# Patient Record
Sex: Male | Born: 1985 | Hispanic: No | Marital: Married | Smoking: Former smoker
Health system: Southern US, Community
[De-identification: ages and names within clinical notes are randomized; demographics above are authoritative.]

## PROBLEM LIST (undated history)

## (undated) DIAGNOSIS — E119 Type 2 diabetes mellitus without complications: Secondary | ICD-10-CM

---
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
## 2005-09-14 ENCOUNTER — Emergency Department (HOSPITAL_COMMUNITY): Admission: EM | Admit: 2005-09-14 | Discharge: 2005-09-14 | Payer: Self-pay | Admitting: Emergency Medicine

## 2005-10-10 ENCOUNTER — Ambulatory Visit (HOSPITAL_COMMUNITY): Admission: RE | Admit: 2005-10-10 | Discharge: 2005-10-10 | Payer: Self-pay | Admitting: *Deleted

## 2006-02-08 ENCOUNTER — Encounter: Admission: RE | Admit: 2006-02-08 | Discharge: 2006-02-08 | Payer: Self-pay | Admitting: Orthopedic Surgery
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
## 2009-04-03 ENCOUNTER — Emergency Department (HOSPITAL_COMMUNITY): Admission: EM | Admit: 2009-04-03 | Discharge: 2009-04-03 | Payer: Self-pay | Admitting: Family Medicine

## 2009-05-17 ENCOUNTER — Ambulatory Visit: Payer: Self-pay | Admitting: Family Medicine

## 2009-05-17 DIAGNOSIS — E1165 Type 2 diabetes mellitus with hyperglycemia: Secondary | ICD-10-CM

## 2009-05-17 LAB — CONVERTED CEMR LAB: Hgb A1c MFr Bld: 8.6 %

## 2009-10-07 ENCOUNTER — Encounter (INDEPENDENT_AMBULATORY_CARE_PROVIDER_SITE_OTHER): Payer: Self-pay

## 2011-02-17 ENCOUNTER — Encounter: Payer: Self-pay | Admitting: *Deleted

## 2011-04-04 LAB — GLUCOSE, CAPILLARY: Glucose-Capillary: 207 mg/dL — ABNORMAL HIGH (ref 70–99)

## 2011-04-05 LAB — POCT I-STAT, CHEM 8
BUN: 12 mg/dL (ref 6–23)
Calcium, Ion: 1.11 mmol/L — ABNORMAL LOW (ref 1.12–1.32)
Chloride: 97 meq/L (ref 96–112)
Creatinine, Ser: 0.7 mg/dL (ref 0.4–1.5)
Glucose, Bld: 472 mg/dL — ABNORMAL HIGH (ref 70–99)
HCT: 50 % (ref 39.0–52.0)
Hemoglobin: 17 g/dL (ref 13.0–17.0)
Potassium: 4.1 meq/L (ref 3.5–5.1)
Sodium: 135 meq/L (ref 135–145)
TCO2: 29 mmol/L (ref 0–100)

## 2011-04-05 LAB — GLUCOSE, CAPILLARY
Glucose-Capillary: 192 mg/dL — ABNORMAL HIGH (ref 70–99)
Glucose-Capillary: 508 mg/dL (ref 70–99)

## 2012-08-20 ENCOUNTER — Encounter: Payer: Self-pay | Admitting: Family Medicine

## 2012-08-20 ENCOUNTER — Ambulatory Visit (INDEPENDENT_AMBULATORY_CARE_PROVIDER_SITE_OTHER): Payer: Self-pay | Admitting: Family Medicine

## 2012-08-20 VITALS — BP 117/80 | HR 58 | Ht 67.0 in | Wt 188.0 lb

## 2012-08-20 DIAGNOSIS — L259 Unspecified contact dermatitis, unspecified cause: Secondary | ICD-10-CM

## 2012-08-20 DIAGNOSIS — E119 Type 2 diabetes mellitus without complications: Secondary | ICD-10-CM

## 2012-08-20 DIAGNOSIS — L309 Dermatitis, unspecified: Secondary | ICD-10-CM

## 2012-08-20 LAB — CBC WITH DIFFERENTIAL/PLATELET
Eosinophils Absolute: 0.1 10*3/uL (ref 0.0–0.7)
Hemoglobin: 15.4 g/dL (ref 13.0–17.0)
Lymphocytes Relative: 38 % (ref 12–46)
MCH: 31.4 pg (ref 26.0–34.0)
MCHC: 34.2 g/dL (ref 30.0–36.0)
MCV: 91.8 fL (ref 78.0–100.0)
Monocytes Absolute: 0.5 10*3/uL (ref 0.1–1.0)
Monocytes Relative: 7 % (ref 3–12)
Neutrophils Relative %: 53 % (ref 43–77)
Platelets: 258 10*3/uL (ref 150–400)
RDW: 13.5 % (ref 11.5–15.5)
WBC: 7.3 10*3/uL (ref 4.0–10.5)

## 2012-08-20 LAB — COMPREHENSIVE METABOLIC PANEL
ALT: 105 U/L — ABNORMAL HIGH (ref 0–53)
AST: 45 U/L — ABNORMAL HIGH (ref 0–37)
Albumin: 4.7 g/dL (ref 3.5–5.2)
CO2: 28 mEq/L (ref 19–32)
Calcium: 9.7 mg/dL (ref 8.4–10.5)

## 2012-08-20 LAB — LDL CHOLESTEROL, DIRECT: Direct LDL: 87 mg/dL

## 2012-08-20 MED ORDER — PREDNISONE 20 MG PO TABS: 20.0000 mg | ORAL_TABLET | Freq: Every day | ORAL | Status: AC

## 2012-08-20 MED ORDER — METFORMIN HCL 500 MG PO TABS: 500.0000 mg | ORAL_TABLET | Freq: Two times a day (BID) | ORAL | Status: DC

## 2012-08-20 NOTE — Patient Instructions (Addendum)
Fue un placer verle de nuevo hoy.   Para los granos en la piel, pienso que puede ser relacionado con alguna reaccion a un producto de higiene (detergente en el trabajo, etc).   Le estoy recetando PREDNISONA 20mg  UNA TABLETA DIARIO POR 5 DIAS.   Despues de los cinco dias de prednisona, quiero que tome LORATADINE 10mg  UNA TABLETA POR DIA.   PARA la diabetes, quiero que vuelva a tomar la METFORMIN 500mg  UNA TABLETA 2 VECES POR DIA.    Quiero que venga de nuevo para chequearle los laboratorios, estoy dejando ordenes en la computadora.   Quiero volver a verle en 1 A 2 MESES.  FOLLOW UP WITH DR Mauricio Po IN 1 TO 2 MONTHS FOR FOLLOW UP.

## 2012-08-21 NOTE — Assessment & Plan Note (Signed)
Not on meds for DM; discussed need for dilated eye exam annually.  To check labs including LDL cholesterol (nonfasting). Will need flu shot in Fall.

## 2012-08-21 NOTE — Assessment & Plan Note (Signed)
Diffuse nonpruritic skinlesions over entire body; sparing head, soles and palms. 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 Discussed option of a brief trial of low-dose systemic steroids for 5 days, recognizing this will likely increase blood glucose temporarily. To ask about detergents at work as well.  Will send some labs today; to include RPR despite low likelihood.  For follow up in the coming 1-2 months.

## 2012-08-21 NOTE — Progress Notes (Signed)
  Subjective:    Patient ID: Vincent Cochran, male    DOB: 08-Jan-1986, 26 y.o.   MRN: [id number]  HPI Visit conducted in Spanish. Patient here accompanied by his wife.   History of DM diagnosed 2 years ago; he has been out of his metformin for about 7 months, does not describe symptoms of hyperglycemia (no polydipsia, no polyuria, no weight changes).  His A1c today is quite elevated at 11.4%.   Presents out of concern for generalized red raised macules that have erupted recently.  Are not pruritic.  Started around ankles, but he has them on entire legs, arms, abdomen, trunk at this time.  Spares the scalp, face, soles and palms. No one at home with similar lesions.  Lives with wife and two children (one less than 2 yrs, the other in grade school).  Smokes 2 cigarettes a week, usually when at work.  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  He wears a protective coverall, which is washed by the company off-site (never wears it home).  Unsure if changes in detergents; he has not had a change in hygiene products or home laundry detergents (wife confirms this).    Family Hx; Father with adult-onset DM.    Review of SystemsSee above     Objective:   Physical Exam Well appearing, no apparent distress HEENT Neck supple,. No cervical adenopathy. Moist mucus membranes. Clear conjunctivae.  COR Regular S1S2 PULM Clear bilat FEET: no edema. Sensation with monofilament decreased in forefoot bilaterally. No maceration of skin between toes. No open areas.  SKIN: Raised erythematous macules, not fluctuant, across ankles, lower legs; bilateral forearms; abdomen, trunk; back.  Not in "Christmas tree" distribution.  Not excoriated. Spares palms and soles, also spares face and scalp.        Assessment & Plan:

## 2012-08-22 ENCOUNTER — Telehealth: Payer: Self-pay | Admitting: Family Medicine

## 2012-08-22 ENCOUNTER — Encounter: Payer: Self-pay | Admitting: Family Medicine

## 2012-08-22 NOTE — Telephone Encounter (Signed)
Called patient's home number to relay lab results.  No answer or voice mail.  Results letter mailed explaining lab results, requesting recheck of transaminases at next visit.  Paula Compton, MD

## 2012-10-15 ENCOUNTER — Ambulatory Visit (INDEPENDENT_AMBULATORY_CARE_PROVIDER_SITE_OTHER): Payer: Self-pay | Admitting: Family Medicine

## 2012-10-15 ENCOUNTER — Encounter: Payer: Self-pay | Admitting: Family Medicine

## 2012-10-15 VITALS — BP 122/85 | HR 60 | Ht 66.0 in | Wt 190.0 lb

## 2012-10-15 DIAGNOSIS — B360 Pityriasis versicolor: Secondary | ICD-10-CM

## 2012-10-15 DIAGNOSIS — B356 Tinea cruris: Secondary | ICD-10-CM | POA: Insufficient documentation

## 2012-10-15 DIAGNOSIS — L52 Erythema nodosum: Secondary | ICD-10-CM

## 2012-10-15 MED ORDER — KETOCONAZOLE 2 % EX SHAM: MEDICATED_SHAMPOO | CUTANEOUS | Status: DC

## 2012-10-15 NOTE — Patient Instructions (Addendum)
Creo que la roncha se debe a una infeccion de hongo que se llama "tinea".   Mande' una receta para un champu que se llama Ketoconazole 2%; uselo en todo el cuerpo en la hora de banarse por 5 dias seguidos, entonces 2 dias de la semana despues.   Quiero verle en un mes si no esta' resuelto.

## 2012-10-16 DIAGNOSIS — L52 Erythema nodosum: Secondary | ICD-10-CM | POA: Insufficient documentation

## 2012-10-16 NOTE — Assessment & Plan Note (Signed)
Plain KOH microscopy performed by me with possible scant hyphae noted.  I suspect erythema nodosum, however to consider widely disseminated tinea infection as well.  To treat for presumed tinea and see back. If EN , then should resolve on its own.  For interval follow up to see change and consider biopsy if worsening.

## 2012-10-16 NOTE — Progress Notes (Signed)
  Subjective:    Patient ID: Vincent Cochran, male    DOB: 08-02-1986, 26 y.o.   MRN: [id number]  HPI Visit in Spanish.  Here with his wife.  The red skin lesions have not improved.  No changes in hygiene products or detergents at home or at work.  Mildly pruritic.  Only over the legs.  Topical steroids helped with the itch    Review of Systems     Objective:   Physical Exam Well appearing, no apparent distress SKIN: erythematous macules, some  flat, others raised and nodular, over legs.  Sparing soles.  None fluctuant. A few that appear to be in later stages that appear similar to ecchymoses.         Assessment & Plan:

## 2012-11-04 ENCOUNTER — Encounter: Payer: Self-pay | Admitting: Home Health Services

## 2012-11-06 ENCOUNTER — Encounter: Payer: Self-pay | Admitting: Home Health Services

## 2013-03-12 ENCOUNTER — Encounter: Payer: Self-pay | Admitting: *Deleted

## 2013-04-30 ENCOUNTER — Other Ambulatory Visit: Payer: Self-pay | Admitting: Family Medicine

## 2013-06-10 ENCOUNTER — Ambulatory Visit (INDEPENDENT_AMBULATORY_CARE_PROVIDER_SITE_OTHER): Payer: Self-pay | Admitting: Family Medicine

## 2013-06-10 ENCOUNTER — Encounter: Payer: Self-pay | Admitting: Family Medicine

## 2013-06-10 VITALS — BP 114/79 | HR 60 | Ht 66.0 in | Wt 182.5 lb

## 2013-06-10 DIAGNOSIS — L52 Erythema nodosum: Secondary | ICD-10-CM

## 2013-06-10 DIAGNOSIS — E1165 Type 2 diabetes mellitus with hyperglycemia: Secondary | ICD-10-CM

## 2013-06-10 DIAGNOSIS — R7401 Elevation of levels of liver transaminase levels: Secondary | ICD-10-CM | POA: Insufficient documentation

## 2013-06-10 LAB — COMPREHENSIVE METABOLIC PANEL
BUN: 17 mg/dL (ref 6–23)
CO2: 28 mEq/L (ref 19–32)
Calcium: 10.1 mg/dL (ref 8.4–10.5)
Creat: 0.74 mg/dL (ref 0.50–1.35)
Glucose, Bld: 211 mg/dL — ABNORMAL HIGH (ref 70–99)
Total Bilirubin: 0.8 mg/dL (ref 0.3–1.2)

## 2013-06-10 LAB — POCT GLYCOSYLATED HEMOGLOBIN (HGB A1C): Hemoglobin A1C: 8.3

## 2013-06-10 MED ORDER — METFORMIN HCL 500 MG PO TABS: 1000.0000 mg | ORAL_TABLET | Freq: Two times a day (BID) | ORAL | Status: DC

## 2013-06-10 MED ORDER — METFORMIN HCL 500 MG PO TABS: ORAL_TABLET | ORAL | Status: DC

## 2013-06-10 NOTE — Patient Instructions (Addendum)
Fue un Research officer, trade union.  La prueba para el Mining engineer' mejor que antes (8.3%), pero todavia no esta' al nivel que queremos (menos de 7%).  Por eso, quiero aumentarle la dosis de la metformin 500mg  a 2 tabletas (1000mg ) 2 veces por dia. Mande' la receta nueva a la farmacia de [place] en [employer].  Para los nodulos en la piel, creo que es una condicion que se llama de Erythema nodosum.  Me gustaria hacerle una placa del pecho y posiblemente hacer una biopsia (sacar Lauris Poag) de la piel para determinar la causa.  Por favor traigame la cajita de la medicina de [place] que le ayudo' con este problema para yo saber que cosa fue.   Quiero volver a verle en 3 meses.   FOLLOW UP IN 3 MONTHS WITH DR Mauricio Po, OR SOONER AS NEEDED. ORANGE CARD.  RETINAL CAMERA EYE EXAM.

## 2013-06-11 ENCOUNTER — Telehealth: Payer: Self-pay | Admitting: Family Medicine

## 2013-06-11 NOTE — Assessment & Plan Note (Signed)
Unclear etiology.  To recheck CMet today; if remains elevated, to plan for RUQ [place] evaluation, CBC, viral hep panel, HIV. Denies regular EtOH or supplement/otc med use. Also to consider fasting lipid panel.

## 2013-06-11 NOTE — Telephone Encounter (Signed)
Called patient's home, spoke with wife (he is not home).  Notified that metabolic panel is unremarkable except for slightly elevated transaminases, which are better than before.  To come in for non-fasting labs when he is able (will check synthetic function, viral hepatitis panel, HIV, bilirubin).  Lack of elevation of Alk Phos makes cholestasis less likely.  Paula Compton, MD

## 2013-06-11 NOTE — Progress Notes (Signed)
  Subjective:    Patient ID: Vincent Cochran, male    DOB: 1986-07-26, 27 y.o.   MRN: [id number]  HPI Here for follow up of DM and skin lesionso n legs.  Visit in Spanish.  Wife is present as well.   Errin notes that he has felt better since taking metformin 500mg  bid.  Ran out 3 days ago.  No more polyuria or generalized weakness.  Initial A1c at diagnosis was in the 11-range.   Has had elevated transaminases in an ALT:AST 2:1 ratio. Rare alcohol one time a month.  No known exposure to viral hepatitis in the past.  No other over the counter medications or supplements.    Regarding skin lesions, has red bumps on legs that had gotten better with use of ketoconazole shampoo as body wash.  Initial lesions suspicious for EN, and I think this is still the case. Are still pruritic. Used a topical cream from [place] (does not recall the name) and was helpfulwhile he used it.   Social Hx. Smokes 1-2 cigarettes a month.  Drinks [employer] energy drinks.  Rare alcohol one time a month.  No known exposure to viral hepatitis in the past.  No other over the counter medications or supplements.    Review of Systems No diarrhea, no nausea/vomiting, no chest pain, no dyspnea, no fevers or chills.      Objective:   Physical Exam Well appearing, no apparent distress HEENT Neck supple. No adenopathy (Cervical or supraclavicular). COR Regular S1S2, no extra sounds PULM Clear bilaterally, no rales or wheezes ABD Soft, nontender.  MSK Raised erythematous nodules measuring @1cm  diameter across anterior shins bilaterally; not excoriated.  Not fluctuant.         Assessment & Plan:

## 2013-06-11 NOTE — Assessment & Plan Note (Signed)
Lesions on legs appears like EN.  Given association of EN with conditions such as Sarcoidosis, will order CXR.  For follow up visit for skin biopsy of lesion for a more definitive diagnosis.

## 2013-06-11 NOTE — Assessment & Plan Note (Signed)
Clinical improvement with initiation of metformin.  Patient reports adherence to the medication. Discussed smoking, activity and lifestyle.  Plan for increase in metformin dosing to max dose 1g bid.  For retinal camera imaging for DM retinopathy screening.

## 2013-10-16 ENCOUNTER — Other Ambulatory Visit: Payer: Self-pay | Admitting: Family Medicine

## 2013-10-20 ENCOUNTER — Other Ambulatory Visit: Payer: Self-pay | Admitting: Family Medicine

## 2013-11-28 ENCOUNTER — Other Ambulatory Visit: Payer: Self-pay | Admitting: Family Medicine

## 2014-06-11 ENCOUNTER — Telehealth: Payer: Self-pay | Admitting: Family Medicine

## 2014-06-17 NOTE — Telephone Encounter (Signed)
Pt # was incorrect. Was unable to leae message to schedule for BP, A1C and LDL lab work as part of DM care.

## 2015-01-29 ENCOUNTER — Encounter: Payer: Self-pay | Admitting: Family Medicine

## 2015-01-29 ENCOUNTER — Ambulatory Visit (INDEPENDENT_AMBULATORY_CARE_PROVIDER_SITE_OTHER): Payer: Self-pay | Admitting: Family Medicine

## 2015-01-29 VITALS — BP 123/77 | HR 60 | Temp 98.2°F | Ht 66.0 in | Wt 185.1 lb

## 2015-01-29 DIAGNOSIS — R740 Nonspecific elevation of levels of transaminase and lactic acid dehydrogenase [LDH]: Secondary | ICD-10-CM

## 2015-01-29 DIAGNOSIS — E1165 Type 2 diabetes mellitus with hyperglycemia: Secondary | ICD-10-CM

## 2015-01-29 DIAGNOSIS — IMO0002 Reserved for concepts with insufficient information to code with codable children: Secondary | ICD-10-CM

## 2015-01-29 DIAGNOSIS — B356 Tinea cruris: Secondary | ICD-10-CM

## 2015-01-29 DIAGNOSIS — R7401 Elevation of levels of liver transaminase levels: Secondary | ICD-10-CM

## 2015-01-29 LAB — COMPREHENSIVE METABOLIC PANEL
ALT: 49 U/L (ref 0–53)
AST: 22 U/L (ref 0–37)
Albumin: 4.8 g/dL (ref 3.5–5.2)
Alkaline Phosphatase: 67 U/L (ref 39–117)
BUN: 11 mg/dL (ref 6–23)
CALCIUM: 9.6 mg/dL (ref 8.4–10.5)
CO2: 32 meq/L (ref 19–32)
CREATININE: 0.72 mg/dL (ref 0.50–1.35)
Chloride: 97 mEq/L (ref 96–112)
Glucose, Bld: 242 mg/dL — ABNORMAL HIGH (ref 70–99)
Potassium: 4.4 mEq/L (ref 3.5–5.3)
SODIUM: 137 meq/L (ref 135–145)
Total Bilirubin: 0.7 mg/dL (ref 0.2–1.2)
Total Protein: 7.3 g/dL (ref 6.0–8.3)

## 2015-01-29 LAB — LIPID PANEL
CHOL/HDL RATIO: 3.4 ratio
Cholesterol: 139 mg/dL (ref 0–200)
HDL: 41 mg/dL (ref >39)
LDL Cholesterol: 75 mg/dL (ref 0–99)
Triglycerides: 114 mg/dL (ref <150)
VLDL: 23 mg/dL (ref 0–40)

## 2015-01-29 LAB — POCT GLYCOSYLATED HEMOGLOBIN (HGB A1C): HEMOGLOBIN A1C: 11.3

## 2015-01-29 MED ORDER — CLOTRIMAZOLE 1 % EX CREA: 1.0000 "application " | TOPICAL_CREAM | Freq: Two times a day (BID) | CUTANEOUS | Status: DC

## 2015-01-29 MED ORDER — METFORMIN HCL 500 MG PO TABS: 500.0000 mg | ORAL_TABLET | Freq: Two times a day (BID) | ORAL | Status: DC

## 2015-01-29 NOTE — Assessment & Plan Note (Signed)
Patient now off metformin about 1 year; no follow up since last visit here. Plan to reassess A1C and fasting labs, restart metformin now and addition of ACEI and likely ASA and consider statin after fasting labs are done. Discussed diet and exercise, he eats a fair amount of fried foods and we discussed this. To titrate down his diet sodas as well with preference for water.  Follow up in the coming 1-2 months.

## 2015-01-29 NOTE — Patient Instructions (Signed)
Fue un Research officer, trade unionplacer verle hoy.  Es importante mantener mas seguimiento para controlarle la diabetes.   Dieta de vegetal, menos fritura, mas agua. Limitar sodas aun de dieta.  Para el hongo, clotrimazole crema, aplicar 2 veces por dia, por 2 semanas.   PAra la diabetes, METFORMINA 500mg  una tableta manana y tarde (2 veces por dia).  Laboratorios hoy, estas en ayunas.  Quiero volver a verle en 4 a seis semanas.   FOLLOW UP IN 4 TO 6 WEEKS WITH [facility] PATIENT'S NEW CELL PHONE IS [phone], PLEASE UPDATE DEMOGRAPHICS.

## 2015-01-29 NOTE — Assessment & Plan Note (Signed)
Foreskin and head of penis with what appears to be candidal infection. Likely secondary to elevated glucose levels. Discussed this with him.

## 2015-01-29 NOTE — Assessment & Plan Note (Signed)
Rechecking CMet today; prior elevated transaminases over 18 months ago.  Will consider more workup pending results of these labs.

## 2015-01-29 NOTE — Progress Notes (Signed)
   Subjective:    Patient ID: Vincent Cochran, male    DOB: 02/11/1986, 29 y.o.   MRN: [id number]  HPI Visit conducted in Spanish. Patient's wife is here with him for portions of history, absent for exam and some sensitive history.   Patient for follow up, reports he has been feeling well since his last visit for DM follow up in June 2014.  Denies polydipsia, polyuria, but does report some foreskin itch and rash that is very bothersome.  Denies penile discharge, denies any sexual encounter outside his marital relationship.  Denies dysuria. No prior STI in the past.   Regarding DM, he had been taking metformin 1000mg  twice daily for a few months after his last appointment, but stopped when ran out.  Has felt well so didn't feel need to follow up.  Has used a shake of cilantro, nopal fruit (prickly pear cactus), celery which he takes about once a week. In reviewing his diet, he eats a fair amount of fried foods prepared at home.  Also drinks diet Coke a lot when thirsty.   Denies smoking since quitting about 2-1/2 years ago.  Drinks seldom, and in social situations per his report. Married, here with his wife today.     Review of Systems Denies fevers/chills, no cough or shortness of breath, no chest pain, no abdominal pain, no diarrhea or constipation or blood in stool. See above for remaining ROS.      Objective:   Physical Exam Well appearing, no apparent distress HEENT Neck supple, no cervical adenopathy, normal mucus membranes.  COR regular S1S2, no extra sounds PULM Clear bilaterally.  ABD Soft, nontender, nondistended.  GU: Uncircumcised; retraction of foreskin reveals erythematous head of penis with linear cracks in foreskin. Beefy red lesions, superficial. No urethral meatus discharge or erythema. FEET: inspection without tinea pedis between toes. Sensation in all areas intact with testing. No edema; palpable dp pulses bilaterally.        Assessment & Plan:

## 2015-02-01 ENCOUNTER — Encounter: Payer: Self-pay | Admitting: Family Medicine

## 2015-03-05 ENCOUNTER — Ambulatory Visit: Payer: Self-pay | Admitting: Family Medicine

## 2016-04-03 ENCOUNTER — Encounter: Payer: Self-pay | Admitting: Family Medicine

## 2018-06-28 ENCOUNTER — Emergency Department (HOSPITAL_COMMUNITY)
Admission: EM | Admit: 2018-06-28 | Discharge: 2018-06-28 | Disposition: A | Discharge status: Home / Self Care | Payer: Self-pay | Attending: Emergency Medicine | Admitting: Emergency Medicine

## 2018-06-28 ENCOUNTER — Encounter (HOSPITAL_COMMUNITY): Payer: Self-pay

## 2018-06-28 ENCOUNTER — Emergency Department (HOSPITAL_COMMUNITY): Payer: Self-pay

## 2018-06-28 DIAGNOSIS — E119 Type 2 diabetes mellitus without complications: Secondary | ICD-10-CM | POA: Insufficient documentation

## 2018-06-28 DIAGNOSIS — X58XXXA Exposure to other specified factors, initial encounter: Secondary | ICD-10-CM | POA: Insufficient documentation

## 2018-06-28 DIAGNOSIS — S20211A Contusion of right front wall of thorax, initial encounter: Secondary | ICD-10-CM | POA: Insufficient documentation

## 2018-06-28 DIAGNOSIS — Y929 Unspecified place or not applicable: Secondary | ICD-10-CM | POA: Insufficient documentation

## 2018-06-28 DIAGNOSIS — S20219A Contusion of unspecified front wall of thorax, initial encounter: Secondary | ICD-10-CM

## 2018-06-28 DIAGNOSIS — Y9389 Activity, other specified: Secondary | ICD-10-CM | POA: Insufficient documentation

## 2018-06-28 DIAGNOSIS — S20213A Contusion of bilateral front wall of thorax, initial encounter: Secondary | ICD-10-CM

## 2018-06-28 DIAGNOSIS — S20212A Contusion of left front wall of thorax, initial encounter: Secondary | ICD-10-CM | POA: Insufficient documentation

## 2018-06-28 DIAGNOSIS — Y998 Other external cause status: Secondary | ICD-10-CM | POA: Insufficient documentation

## 2018-06-28 DIAGNOSIS — Z87891 Personal history of nicotine dependence: Secondary | ICD-10-CM | POA: Insufficient documentation

## 2018-06-28 DIAGNOSIS — Z7984 Long term (current) use of oral hypoglycemic drugs: Secondary | ICD-10-CM | POA: Insufficient documentation

## 2018-06-28 HISTORY — DX: Type 2 diabetes mellitus without complications: E11.9

## 2018-06-28 MED ORDER — IBUPROFEN 800 MG PO TABS: 800.0000 mg | ORAL_TABLET | Freq: Three times a day (TID) | ORAL | 0 refills | Status: AC | PRN

## 2018-06-28 NOTE — ED Triage Notes (Signed)
Pt reports working on car couple days ago and the tool being used hit on the left and right lateral rib. Pain increased with movement . Pain decreased when sitting

## 2018-06-28 NOTE — Discharge Instructions (Addendum)
You may alternate with ice and heat to your ribs.  Avoid excessive lifting or reaching movements for 1 week.  Follow-up with your primary doctor or return here for any worsening symptoms such as increasing pain or shortness of breath.

## 2018-06-30 NOTE — ED Provider Notes (Signed)
[facility] EMERGENCY DEPARTMENT Provider Note   CSN: [id number] Arrival date & time: 06/28/18  0272     History   Chief Complaint Chief Complaint  Patient presents with  . Chest Pain    HPI Vincent Cochran is a 32 y.o. male.  HPI   Vincent Cochran is a 32 y.o. male who presents to the Emergency Department complaining of bilateral lateral/anterior rib pain.  Symptoms present for 2 days.  States that he was using a tool to working on a car few days ago and was leaning over both sides on the car.  Noticed "soreness" to both sides of his chest that is associated with deep breathing and moving.  Pain improves at rest.  He also states that the pain is improving since onset, but he is concerned that pain will worsen when he returns to work because his job requires heavy lifting and bending.  He denies shortness of breath, abdominal pain, vomiting, upper extremity pain or weakness, and back pain.  Has taken tylenol with minimal relief.    Past Medical History:  Diagnosis Date  . Diabetes mellitus without complication [facility])     Patient Active Problem List   Diagnosis Date Noted  . Elevated transaminase level 06/10/2013  . Erythema nodosum, acute form 10/16/2012  . Tinea cruris 10/15/2012  . Dermatitis 08/20/2012  . DM (diabetes mellitus), type 2, uncontrolled (HCC) 05/17/2009    History reviewed. No pertinent surgical history.    Home Medications    Prior to Admission medications   Medication Sig Start Date End Date Taking? Authorizing Provider  clotrimazole (LOTRIMIN) 1 % cream Apply 1 application topically 2 (two) times daily. 01/29/15   Barbaraann Barthel, MD  ibuprofen (ADVIL,MOTRIN) 800 MG tablet Take 1 tablet (800 mg total) by mouth every 8 (eight) hours as needed. Take with food 06/28/18   Lakyra Tippins, PA-C  metFORMIN (GLUCOPHAGE) 500 MG tablet Take 1 tablet (500 mg total) by mouth 2 (two) times daily. 01/29/15   Barbaraann Barthel, MD    Family History No family history on  file.  Social History Social History   Tobacco Use  . Smoking status: Former Smoker    Types: Cigarettes  . Tobacco comment: only smokes 2 cig/day  Substance Use Topics  . Alcohol use: Not Currently    Alcohol/week: 0.0 oz  . Drug use: Not Currently     Allergies   Patient has no known allergies.   Review of Systems Review of Systems  Constitutional: Negative for chills, diaphoresis and fever.  Respiratory: Negative for chest tightness, shortness of breath and wheezing.   Cardiovascular: Positive for chest pain (bilateral rib pain). Negative for leg swelling.  Gastrointestinal: Negative for abdominal pain, nausea and vomiting.  Genitourinary: Negative for difficulty urinating, flank pain and hematuria.  Musculoskeletal: Negative for arthralgias, back pain, joint swelling and neck pain.  Skin: Negative for color change and wound.  Neurological: Negative for dizziness, seizures, weakness and headaches.  All other systems reviewed and are negative.    Physical Exam Updated Vital Signs BP 128/83 (BP Location: Left Arm)   Pulse (!) 52   Temp (!) 97.5 F (36.4 C) (Oral)   Resp 15   Wt 83.9 kg (185 lb)   SpO2 100%   BMI 29.86 kg/m   Physical Exam  Constitutional: He appears well-developed and well-nourished. No distress.  HENT:  Head: Atraumatic.  Mouth/Throat: Oropharynx is clear and moist.  Neck: Normal range of motion. Neck supple.  Cardiovascular: Normal rate, regular rhythm and intact distal pulses.  Pulmonary/Chest: Effort normal and breath sounds normal. No respiratory distress. He exhibits tenderness (diffuse ttp of the bilateral anterolateral lower ribs.  no edema, crepitus or bony deformity).  Abdominal: Soft. Bowel sounds are normal. He exhibits no distension. There is no tenderness. There is no guarding.  Musculoskeletal: Normal range of motion. He exhibits no edema.  Neurological: He is alert. No sensory deficit.  Skin: Skin is warm. Capillary refill  takes less than 2 seconds.  Psychiatric: He has a normal mood and affect.  Nursing note and vitals reviewed.    ED Treatments / Results  Labs (all labs ordered are listed, but only abnormal results are displayed) Labs Reviewed - No data to display  EKG None  Radiology Dg Chest 2 View  Result Date: 06/28/2018 CLINICAL DATA:  32 year old male with a history of pain EXAM: CHEST - 2 VIEW COMPARISON:  None. FINDINGS: The heart size and mediastinal contours are within normal limits. Both lungs are clear. The visualized skeletal structures are unremarkable, with no acute displaced fracture. IMPRESSION: Negative for acute cardiopulmonary disease. No acute displaced fracture identified. Electronically Signed   By: Gilmer MorJaime  Wagner D.O.   On: 06/28/2018 09:31     Procedures Procedures (including critical care time)  Medications Ordered in ED Medications - No data to display   Initial Impression / Assessment and Plan / ED Course  I have reviewed the triage vital signs and the nursing notes.  Pertinent labs & imaging results that were available during my care of the patient were reviewed by me and considered in my medical decision making (see chart for details).     Pt well appearing.  CXR neg for fx.  Pt well appearing.  No respiratory distress, no abd tenderness, no guarding.  Pt agrees to symptomatic tx.  Requesting work note, return precautions discussed.    Final Clinical Impressions(s) / ED Diagnoses   Final diagnoses:  Bilateral contusion of ribs    ED Discharge Orders        Ordered    ibuprofen (ADVIL,MOTRIN) 800 MG tablet  Every 8 hours PRN     06/28/18 0941       Pauline Ausriplett, Cassidi Modesitt, PA-C 06/30/18 2022    Mancel BaleWentz, Elliott, MD 07/03/18 [phone]

## 2020-01-14 ENCOUNTER — Other Ambulatory Visit: Payer: Self-pay

## 2020-01-14 ENCOUNTER — Ambulatory Visit (HOSPITAL_COMMUNITY)
Admission: EM | Admit: 2020-01-14 | Discharge: 2020-01-14 | Disposition: A | Discharge status: Home / Self Care | Payer: Self-pay | Attending: Family Medicine | Admitting: Family Medicine

## 2020-01-14 ENCOUNTER — Encounter (HOSPITAL_COMMUNITY): Payer: Self-pay

## 2020-01-14 DIAGNOSIS — H9201 Otalgia, right ear: Secondary | ICD-10-CM

## 2020-01-14 MED ORDER — FLUTICASONE PROPIONATE 50 MCG/ACT NA SUSP: 1.0000 | Freq: Every day | NASAL | 2 refills | Status: DC

## 2020-01-14 NOTE — ED Provider Notes (Signed)
[facility]    CSN: [id number] Arrival date & time: 01/14/20  1249      History   Chief Complaint Chief Complaint  Patient presents with  . Headache    HPI Vincent Cochran is a 34 y.o. male.   Pt is a 34 year old male that presents today with approximated 5 days of improving right ear pain, right eye watering, right temporal pain. Symptoms have been constant, improved with tylenol and ibuprofen. Started after taking dog outside and felt cold wind in the right eye and ear. No foreign body, or sensation. No eye pain, trouble with vision. No dizziness, weakness, numbness or tingling. No nasal congestion, rhinorrhea, cough, chest congestion, fevers.  All information obtained using the Engineer, structural.   ROS per HPI      Past Medical History:  Diagnosis Date  . Diabetes mellitus without complication [facility])     Patient Active Problem List   Diagnosis Date Noted  . Elevated transaminase level 06/10/2013  . Erythema nodosum, acute form 10/16/2012  . Tinea cruris 10/15/2012  . Dermatitis 08/20/2012  . DM (diabetes mellitus), type 2, uncontrolled (HCC) 05/17/2009    History reviewed. No pertinent surgical history.     Home Medications    Prior to Admission medications   Medication Sig Start Date End Date Taking? Authorizing Provider  clotrimazole (LOTRIMIN) 1 % cream Apply 1 application topically 2 (two) times daily. 01/29/15   Barbaraann Barthel, MD  fluticasone (FLONASE) 50 MCG/ACT nasal spray Place 1 spray into both nostrils daily. 01/14/20   Dahlia Byes A, NP  ibuprofen (ADVIL,MOTRIN) 800 MG tablet Take 1 tablet (800 mg total) by mouth every 8 (eight) hours as needed. Take with food 06/28/18   Triplett, Tammy, PA-C  metFORMIN (GLUCOPHAGE) 500 MG tablet Take 1 tablet (500 mg total) by mouth 2 (two) times daily. 01/29/15   Barbaraann Barthel, MD    Family History History reviewed. No pertinent family history.  Social History Social History   Tobacco Use  .  Smoking status: Former Smoker    Types: Cigarettes  . Smokeless tobacco: Never Used  . Tobacco comment: only smokes 2 cig/day  Substance Use Topics  . Alcohol use: Not Currently    Alcohol/week: 0.0 standard drinks  . Drug use: Not Currently     Allergies   Patient has no known allergies.   Review of Systems Review of Systems   Physical Exam Triage Vital Signs ED Triage Vitals  Enc Vitals Group     BP 01/14/20 1332 135/80     Pulse Rate 01/14/20 1332 86     Resp 01/14/20 1332 18     Temp 01/14/20 1332 98.2 F (36.8 C)     Temp Source 01/14/20 1332 Oral     SpO2 01/14/20 1332 100 %     Weight 01/14/20 1334 170 lb (77.1 kg)     Height --      Head Circumference --      Peak Flow --      Pain Score 01/14/20 1334 3     Pain Loc --      Pain Edu? --      Excl. in GC? --    No data found.  Updated Vital Signs BP 135/80 (BP Location: Right Arm)   Pulse 86   Temp 98.2 F (36.8 C) (Oral)   Resp 18   Wt 170 lb (77.1 kg)   SpO2 100%   BMI 27.44 kg/m  Visual Acuity Right Eye Distance:   Left Eye Distance:   Bilateral Distance:    Right Eye Near:   Left Eye Near:    Bilateral Near:     Physical Exam Vitals and nursing note reviewed.  Constitutional:      General: He is not in acute distress.    Appearance: He is well-developed. He is not ill-appearing, toxic-appearing or diaphoretic.  HENT:     Head: Normocephalic and atraumatic.     Comments: No facial swelling or dental pain No sinus pain with palpation.  TMJ normal and without pain.     Right Ear: Tympanic membrane, ear canal and external ear normal.     Left Ear: Tympanic membrane, ear canal and external ear normal.     Ears:     Comments: Mild tenderness to right mastoid without redness, swelling.  External ear normal without pain or swelling.    Mouth/Throat:     Mouth: Mucous membranes are moist.     Pharynx: Oropharynx is clear.     Comments: No tonsillar swelling or erythema.  Eyes:      Conjunctiva/sclera: Conjunctivae normal.     Pupils: Pupils are equal, round, and reactive to light.  Pulmonary:     Effort: Pulmonary effort is normal.  Musculoskeletal:        General: Normal range of motion.     Cervical back: Normal range of motion.  Skin:    General: Skin is warm.  Neurological:     Mental Status: He is alert.  Psychiatric:        Mood and Affect: Mood normal.      UC Treatments / Results  Labs (all labs ordered are listed, but only abnormal results are displayed) Labs Reviewed - No data to display  EKG   Radiology No results found.  Procedures Procedures (including critical care time)  Medications Ordered in UC Medications - No data to display  Initial Impression / Assessment and Plan / UC Course  I have reviewed the triage vital signs and the nursing notes.  Pertinent labs & imaging results that were available during my care of the patient were reviewed by me and considered in my medical decision making (see chart for details).     Right ear pain with headache- no concerning signs or symptoms on exam. No red flags.  No facial swelling, dental pain, concern for mastoiditis.  No concern for infection. Reporting his symptoms have improved over the last 5 days and ibuprofen and Tylenol helps.  Most likely sinus headache versus eustachian tube dysfunction.   We will have him do Flonase daily.  He can continue Tylenol or ibuprofen as needed. Recommend the symptoms continue or worsen he will need to follow-up. Gave contact for primary care follow-up for diabetes Final Clinical Impressions(s) / UC Diagnoses   Final diagnoses:  Right ear pain     Discharge Instructions     Using nasal spray as prescribed. You can use 2 sprays in each nostril until feeling better and then 1 spray daily after that.  Ibuprofen for pain Follow up as needed for continued or worsening symptoms     ED Prescriptions    Medication Sig Dispense Auth. Provider    fluticasone (FLONASE) 50 MCG/ACT nasal spray Place 1 spray into both nostrils daily. 16 g Loura Halt A, NP     PDMP not reviewed this encounter.   Orvan July, NP 01/14/20 1449

## 2020-01-14 NOTE — Discharge Instructions (Signed)
Using nasal spray as prescribed. You can use 2 sprays in each nostril until feeling better and then 1 spray daily after that.  Ibuprofen for pain Follow up as needed for continued or worsening symptoms

## 2020-01-14 NOTE — ED Triage Notes (Signed)
Pt states she has a pain on the right side of his head. Pt states his has been going on for 4 days. Pt states she has some right eye discomfort.

## 2020-01-20 ENCOUNTER — Encounter: Payer: Self-pay | Admitting: Radiation Oncology

## 2020-01-20 ENCOUNTER — Ambulatory Visit: Payer: Self-pay | Admitting: Radiation Oncology

## 2020-01-20 VITALS — BP 137/84 | HR 79 | Temp 98.4°F | Ht 66.0 in

## 2020-01-20 DIAGNOSIS — B356 Tinea cruris: Secondary | ICD-10-CM

## 2020-01-20 DIAGNOSIS — Z872 Personal history of diseases of the skin and subcutaneous tissue: Secondary | ICD-10-CM

## 2020-01-20 DIAGNOSIS — Z87438 Personal history of other diseases of male genital organs: Secondary | ICD-10-CM

## 2020-01-20 DIAGNOSIS — E1165 Type 2 diabetes mellitus with hyperglycemia: Secondary | ICD-10-CM

## 2020-01-20 DIAGNOSIS — R300 Dysuria: Secondary | ICD-10-CM

## 2020-01-20 DIAGNOSIS — B49 Unspecified mycosis: Secondary | ICD-10-CM | POA: Insufficient documentation

## 2020-01-20 DIAGNOSIS — Z0000 Encounter for general adult medical examination without abnormal findings: Secondary | ICD-10-CM | POA: Insufficient documentation

## 2020-01-20 DIAGNOSIS — E118 Type 2 diabetes mellitus with unspecified complications: Secondary | ICD-10-CM

## 2020-01-20 LAB — GLUCOSE, CAPILLARY: Glucose-Capillary: 344 mg/dL — ABNORMAL HIGH (ref 70–99)

## 2020-01-20 LAB — POCT GLYCOSYLATED HEMOGLOBIN (HGB A1C): HbA1c POC (<> result, manual entry): >14 % — AB (ref 4.0–5.6)

## 2020-01-20 MED ORDER — JANUMET 50-1000 MG PO TABS: 1.0000 | ORAL_TABLET | Freq: Two times a day (BID) | ORAL | 3 refills | Status: DC

## 2020-01-20 MED FILL — JANUMET 50-1,000 MG TABLET: 50-1000 | 30 days supply | Qty: 60 | Fill #0

## 2020-01-20 NOTE — Patient Instructions (Signed)
Thank you for coming to your appointment.   Please start taking Janumet twice a day for your diabetes.  Please make sure to wear socks whenever you're dressing or undressing to prevent cross contamination of a fungal infection.  Please follow up with our diabetes coordinator and our financial counselor and we will proceed with additional testing at that time.   Please follow up with [place] after those appointments.

## 2020-01-20 NOTE — Progress Notes (Signed)
   CC: diabetes  HPI:  [name] is a 34 y.o. male who presents to the Internal Medicine Clinic for follow up of their chronic medical problems. Please see Assessment and Plan for full HPI.  Past Medical History:  Diagnosis Date  . Diabetes mellitus without complication (HCC)    Review of Systems:  Please see Assessment and Plan for full ROS.  Review of Systems  Constitutional: Negative for fever, malaise/fatigue and weight loss (4 lbs of weight gain).  HENT: Negative for sore throat.   Eyes: Negative for blurred vision.  Respiratory: Negative for cough and shortness of breath.   Cardiovascular: Negative for chest pain and leg swelling.  Gastrointestinal: Negative for abdominal pain, nausea and vomiting.  Genitourinary: Positive for dysuria.  Musculoskeletal: Negative for falls.  Skin: Positive for itching (feet).  Neurological: Negative for headaches.  Psychiatric/Behavioral: Negative for depression.  All other systems reviewed and are negative.  Physical Exam:  Vitals:   01/20/20 1454  BP: 137/84  Pulse: 79  Temp: 98.4 F (36.9 C)  TempSrc: Oral  SpO2: 100%  Height: 5\' 6"  (1.676 m)   Physical Exam  Constitutional: He is oriented to person, place, and time and well-developed, well-nourished, and in no distress. No distress.  HENT:  Head: Normocephalic and atraumatic.  Cardiovascular: Normal rate, regular rhythm and normal heart sounds.  No murmur heard. Pulmonary/Chest: Effort normal and breath sounds normal. No respiratory distress.  Abdominal: Soft. Bowel sounds are normal. He exhibits no distension.  Genitourinary:    Penis normal.  No discharge found.    Genitourinary Comments: Uncircumcised penis without erythema, lesions or discharge   Musculoskeletal:        General: No tenderness, deformity or edema. Normal range of motion.     Cervical back: Normal range of motion.  Neurological: He is alert and oriented to person, place, and time.  Skin: Skin  is warm and dry. He is not diaphoretic. No erythema.  Feet warm and dry without erythema or rash  Psychiatric: Affect normal.  Nursing note and vitals reviewed.  Assessment & Plan:   See Encounters Tab for problem based charting.  Patient discussed with Dr. 

## 2020-01-21 ENCOUNTER — Encounter: Payer: Self-pay | Admitting: Radiation Oncology

## 2020-01-21 NOTE — Assessment & Plan Note (Signed)
Vincent Cochran is a 34 yo M with a PMHx of type 2 diabetes who was last seen in our clinic 5 years ago. At that time he had been off of metformin for 1 year. He reports that he has not taken metformin since then as his friend told him exercise was sufficient and superior to medications. We discussed the importance of exercise but that sometimes we can't help our genetics and that medications are important to prevent the microscopic damage diabetes can slowly cause. Diabetes runs in his family. His father has diabetes.   Today his A1c is > 14. We discussed the importance of resuming medication. He is amenable to this. Unfortunately, he does not have insurance so we looked for the best options for him on our [employer] 4 dollar list. We discussed that he could likely benefit from additional lab work at this time but given his lack of insurance we were hoping to wait to see if he could obtain an orange card and to start a low cost medication for the time being.  Plan for uncontrolled type 2 diabetes mellitus: -start Janumet 50-1000 BID -fu with financial counseling -fu with Lupita Leash after financial counseling meeting -fu with PCP after financial counseling meeting

## 2020-01-21 NOTE — Assessment & Plan Note (Signed)
Patient due for flu vaccine but due to uninsured status would prefer to wait until he hopefully obtains orange card

## 2020-01-21 NOTE — Assessment & Plan Note (Signed)
On ROS patient reports dysuria. He says he is sexually active and uses protection, however, he has some concern for STIs. Patient with hx of tinea cruris for which he has used clotrimazole in the past. Due to financial concerns, patient does not want STI testing here today. [facility] or return if he gets an orange card. On exam, no concern for active fungal infection. No erythema, lesions or discharge.   Patient reports a hx of fungal infection on his feet as well. We discussed the importance of wearing socks whenever he takes his underwear on and off to avoid cross contamination.  Plan: -fu with financial counseling -STI testing at [facility] department or at fu after orange card -wear socks when changing to avoid cross contamination

## 2020-01-23 NOTE — Progress Notes (Signed)
Internal Medicine Clinic Attending  Case discussed with Dr. Lanier at the time of the visit.  We reviewed the resident's history and exam and pertinent patient test results.  I agree with the assessment, diagnosis, and plan of care documented in the resident's note.  Grover Robinson, M.D., Ph.D.  

## 2020-02-09 ENCOUNTER — Ambulatory Visit: Payer: Self-pay

## 2020-04-13 ENCOUNTER — Other Ambulatory Visit: Payer: Self-pay

## 2020-04-13 ENCOUNTER — Ambulatory Visit: Payer: Self-pay | Admitting: Internal Medicine

## 2020-04-13 VITALS — BP 137/91 | HR 68 | Temp 98.1°F | Ht 66.0 in | Wt 172.7 lb

## 2020-04-13 DIAGNOSIS — Z7984 Long term (current) use of oral hypoglycemic drugs: Secondary | ICD-10-CM

## 2020-04-13 DIAGNOSIS — E1165 Type 2 diabetes mellitus with hyperglycemia: Secondary | ICD-10-CM

## 2020-04-13 DIAGNOSIS — E118 Type 2 diabetes mellitus with unspecified complications: Secondary | ICD-10-CM

## 2020-04-13 LAB — GLUCOSE, CAPILLARY: Glucose-Capillary: 343 mg/dL — ABNORMAL HIGH (ref 70–99)

## 2020-04-13 LAB — POCT GLYCOSYLATED HEMOGLOBIN (HGB A1C): Hemoglobin A1C: 9.9 % — AB (ref 4.0–5.6)

## 2020-04-13 MED ORDER — SITAGLIP PHOS-METFORMIN HCL ER 100-1000 MG PO TB24: 1.0000 | ORAL_TABLET | Freq: Every day | ORAL | 1 refills | Status: DC

## 2020-04-13 MED ORDER — CANAGLIFLOZIN 100 MG PO TABS: 100.0000 mg | ORAL_TABLET | Freq: Every day | ORAL | 1 refills | Status: DC

## 2020-04-13 MED FILL — JANUMET XR 100-1,000 MG TAB: 100-1000 | 30 days supply | Qty: 30 | Fill #0

## 2020-04-13 MED FILL — INVOKANA 100 MG TABLET: 100 | 30 days supply | Qty: 30 | Fill #0

## 2020-04-13 NOTE — Progress Notes (Signed)
   CC: diabetes follow-up  HPI:  [name] is a 34 y.o. M with significant PMH of uncontrolled type II diabetes who presents for follow-up of his diabetes. Please see problem-based charting for additional information.  Due to language barrier, an interpreter, Rebeca # [id number] was present during the history-taking and subsequent discussion (and for part of the physical exam) with this patient.  Past Medical History:  Diagnosis Date  . Diabetes mellitus without complication (HCC)    Review of Systems:   Review of Systems  Constitutional: Negative for chills and fever.  Respiratory: Negative for sputum production.   Cardiovascular: Negative for chest pain.  Gastrointestinal: Negative for abdominal pain, nausea and vomiting.  Genitourinary: Negative for frequency.  Endo/Heme/Allergies: Negative for polydipsia.   Physical Exam:  Vitals:   04/13/20 1019  BP: (!) 137/91  Pulse: 68  Temp: 98.1 F (36.7 C)  TempSrc: Oral  SpO2: 100%  Weight: 172 lb 11.2 oz (78.3 kg)  Height: 5\' 6"  (1.676 m)   Physical Exam Vitals and nursing note reviewed.  Constitutional:      General: He is not in acute distress.    Appearance: He is normal weight. He is not ill-appearing.  Cardiovascular:     Rate and Rhythm: Normal rate and regular rhythm.     Heart sounds: Normal heart sounds.  Pulmonary:     Effort: Pulmonary effort is normal.     Breath sounds: Normal breath sounds.  Abdominal:     General: Abdomen is flat. Bowel sounds are normal. There is no distension.     Palpations: Abdomen is soft.     Tenderness: There is no abdominal tenderness.  Neurological:     Mental Status: He is alert.    Assessment & Plan:   See Encounters Tab for problem based charting.  Patient discussed with Dr. 

## 2020-04-13 NOTE — Patient Instructions (Signed)
Vincent Cochran,  Fue un placer conocerte hoy! Me alegro de que se sienta bien.  El nivel de azcar en sangre fue ms bajo hoy en comparacin con enero, pero todava hay margen de mejora! Comience a tomar canagliflozina una vez al da, adems de la pldora de sitagliptina-metformina una vez al da. Se han enviado nuevas recetas para ambos medicamentos a la farmacia para pacientes ambulatorios.  Lo llamar con los resultados de su anlisis de sangre a finales de [place].  Haga un seguimiento con su mdico de cabecera y nuestro coordinador de diabetes en las prximas 2-3 semanas para [employer].  Gracias por permitirnos ser parte de su cuidado!

## 2020-04-14 LAB — BMP8+ANION GAP
Anion Gap: 16 mmol/L (ref 10.0–18.0)
BUN/Creatinine Ratio: 18 (ref 9–20)
BUN: 12 mg/dL (ref 6–20)
CO2: 26 mmol/L (ref 20–29)
Calcium: 9.8 mg/dL (ref 8.7–10.2)
Chloride: 95 mmol/L — ABNORMAL LOW (ref 96–106)
Creatinine, Ser: 0.67 mg/dL — ABNORMAL LOW (ref 0.76–1.27)
GFR calc Af Amer: 143 mL/min/{1.73_m2} (ref >59)
GFR calc non Af Amer: 124 mL/min/{1.73_m2} (ref >59)
Glucose: 334 mg/dL — ABNORMAL HIGH (ref 65–99)
Potassium: 5.7 mmol/L — ABNORMAL HIGH (ref 3.5–5.2)
Sodium: 137 mmol/L (ref 134–144)

## 2020-04-14 LAB — ANTI-ISLET CELL ANTIBODY: Islet Cell Ab: NEGATIVE

## 2020-04-14 LAB — GLUTAMIC ACID DECARBOXYLASE AUTO ABS: Glutamic Acid Decarb Ab: <5 U/mL (ref 0.0–5.0)

## 2020-04-14 NOTE — Assessment & Plan Note (Signed)
Pt presents today for diabetes management. Was previously prescribed sitagliptin-metformin 50-1000mg  BID in 12/2019. States he experienced diarrhea with the full dose of this medication, so he was only taking one pill a day which improved his GI symptoms. He ran out of his medication 1-2 weeks ago. Endorses checking his blood sugar at home, says it has been running "normal" between 130-135. He checks it once a day either in AM or around 4PM before eating dinner. Pt focused on management of his diabetes through his diet. Describes consistently eating "healthy foods" to avoid fat. Diet contains biscuits, eggs, chicken, tortillas, rice, and beans. He has been feeling well and states this means his sugars are under good control. Denies polyuria, polydipsia, chest pain, shortness of breath, nausea, vomiting, abdominal pain, fevers/chills or recent illness.  A1c today 9.9 (from >14) with CBG 343. Discussed need for tighter glycemic control. Educated pt that he may not feel symptomatic with elevated sugars and diet alone is unlikely to control his diabetes. Pt has had diabetes since 34 years old, per chart review. He is also of normal weight on examination (BMI with mild elevation to 27 likely reflective of increased muscle mass). Question if this pt's diabetes is on the LADA spectrum with an element of decreased insulin production. Approached pt with the idea that he may need insulin therapy in the future to adequately control his sugars, but we will continue with pill medications at this time, per the pt's request.  - follow-up LADA labs (GAD and anti-islet cell antibodies) - add canagliflozin 100mg  daily - switch sitagliptin-metformin to extended release 100-1000mg  daily - 2-3 week follow-up with and PCP - continue to work with Lupita Leash

## 2020-04-14 NOTE — Progress Notes (Signed)
Internal Medicine Clinic Attending  Case discussed with Dr. Jones at the time of the visit.  We reviewed the resident's history and exam and pertinent patient test results.  I agree with the assessment, diagnosis, and plan of care documented in the resident's note.  

## 2020-04-30 ENCOUNTER — Encounter: Payer: Self-pay | Admitting: *Deleted

## 2020-06-01 ENCOUNTER — Ambulatory Visit: Payer: Self-pay | Admitting: Radiation Oncology

## 2020-06-01 ENCOUNTER — Encounter: Payer: Self-pay | Admitting: Radiation Oncology

## 2020-06-01 DIAGNOSIS — B356 Tinea cruris: Secondary | ICD-10-CM

## 2020-06-01 DIAGNOSIS — E1165 Type 2 diabetes mellitus with hyperglycemia: Secondary | ICD-10-CM

## 2020-06-01 DIAGNOSIS — Z0000 Encounter for general adult medical examination without abnormal findings: Secondary | ICD-10-CM

## 2020-06-01 DIAGNOSIS — B49 Unspecified mycosis: Secondary | ICD-10-CM

## 2020-06-01 MED ORDER — FLUTICASONE PROPIONATE 50 MCG/ACT NA SUSP: 1.0000 | Freq: Every day | NASAL | 2 refills | Status: AC

## 2020-06-01 MED ORDER — CANAGLIFLOZIN 100 MG PO TABS: 100.0000 mg | ORAL_TABLET | Freq: Every day | ORAL | 1 refills | Status: AC

## 2020-06-01 MED ORDER — FLUTICASONE PROPIONATE 50 MCG/ACT NA SUSP: 1.0000 | Freq: Every day | NASAL | 2 refills | Status: DC

## 2020-06-01 MED ORDER — CLOTRIMAZOLE 1 % EX CREA: 1.0000 "application " | TOPICAL_CREAM | Freq: Two times a day (BID) | CUTANEOUS | 0 refills | Status: AC

## 2020-06-01 MED ORDER — SITAGLIP PHOS-METFORMIN HCL ER 100-1000 MG PO TB24: 1.0000 | ORAL_TABLET | Freq: Every day | ORAL | 1 refills | Status: AC

## 2020-06-01 MED ORDER — SITAGLIP PHOS-METFORMIN HCL ER 100-1000 MG PO TB24: 1.0000 | ORAL_TABLET | Freq: Every day | ORAL | 1 refills | Status: DC

## 2020-06-01 MED ORDER — CANAGLIFLOZIN 100 MG PO TABS: 100.0000 mg | ORAL_TABLET | Freq: Every day | ORAL | 1 refills | Status: DC

## 2020-06-01 MED ORDER — CLOTRIMAZOLE 1 % EX CREA: 1.0000 "application " | TOPICAL_CREAM | Freq: Two times a day (BID) | CUTANEOUS | 0 refills | Status: DC

## 2020-06-01 NOTE — Patient Instructions (Signed)
Thank you for coming to your appointment. It was so nice to see you. Today we discussed  Vincent Cochran was seen today for diabetes.  Diagnoses and all orders for this visit:        type 2 diabetes mellitus with hyperglycemia (HCC) -     canagliflozin (INVOKANA) 100 MG TABS tablet; Take 1 tablet (100 mg total) by mouth daily before breakfast. -     SitaGLIPtin-MetFORMIN HCl [id number] MG TB24; Take 1 tablet by mouth daily.        Fungal infection -     clotrimazole (LOTRIMIN) 1 % cream; Apply 1 application topically 2 (two) times daily.  Allergies -     fluticasone (FLONASE) 50 MCG/ACT nasal spray; Place 1 spray into both nostrils daily.    Follow up in 6 weeks for diabetes check up.  Sincerely,  Jenell Milliner, MD

## 2020-06-01 NOTE — Progress Notes (Signed)
   CC: diabetes  HPI:  [name] is a 34 y.o. male who presents to the Internal Medicine Clinic for follow up of his diabetes. Please see Assessment and Plan for full HPI.  Past Medical History:  Diagnosis Date  . Diabetes mellitus without complication (HCC)    Review of Systems:  Please see Assessment and Plan for full ROS.  Physical Exam:  Vitals:   06/01/20 1544  BP: 119/80  Pulse: 70  Temp: 98.4 F (36.9 C)  TempSrc: Oral  SpO2: 99%  Weight: 173 lb 3.2 oz (78.6 kg)   Physical Exam  Constitutional: He is oriented to person, place, and time and well-developed, well-nourished, and in no distress. No distress.  HENT:  Head: Normocephalic and atraumatic.  Cardiovascular: Normal rate, regular rhythm and intact distal pulses.  Pulmonary/Chest: Effort normal and breath sounds normal. No respiratory distress.  Abdominal: Soft. Bowel sounds are normal. He exhibits no distension.  Musculoskeletal:     Cervical back: Normal range of motion.  Neurological: He is alert and oriented to person, place, and time.  Normal sensation in the feet bilaterally  Skin: Skin is warm and dry. He is not diaphoretic.  Psychiatric: Affect normal.   Assessment & Plan:   See Encounters Tab for problem based charting.  Patient discussed with Dr. Mikey Bussing

## 2020-06-02 ENCOUNTER — Encounter: Payer: Self-pay | Admitting: Radiation Oncology

## 2020-06-02 NOTE — Assessment & Plan Note (Signed)
Unfortunately, pt is self pay and cannot currently afford all the appropriate screening labs and vaccinations. We discussed following up with the financial counselor and he reports he saw her twice but doesn't have all the needed documents and is unable to get them as he and his wife are having issues. He is looking for insurance. 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 I told him that he would probably needs to at least be up to date on his tetanus. He reports that he had one four years ago which is great. He was also counseled on the importance of getting a covid vaccine which is free and he said he would schedule an appointment to get one.   Plan -schedule apt for covid vaccine

## 2020-06-02 NOTE — Assessment & Plan Note (Addendum)
This is chronic.  Patient w/ hx of DM2 presenting for medication refill. He reports he went to [place] recently and didn't take his meds for around 2 weeks but felt fine. He was counseled on the importance of continuing his meds. We also discussed his need for additional lab work, evaluation and vaccinations given his diabetes. Unfortunately, he is self pay and cannot currently afford the standard work up and treatment. We discussed following up with the financial counselor and he reports he saw her twice but doesn't have all the needed documents and is unable to get them as he and his wife are having issues. He is looking for insurance. We talked about undergoing a retinal scan here in the office but given it would cost him money, he wouldn't be able to follow up with ophtho if something was found and the fact that treatment with glucose control and BP control is the most important thing, we decided to postpone retinal scan at this time.   On exam, feet without wounds, ulcers, erythema. Good sensation throughout. Good pulses.  Plan -refill meds -fu in 6 weeks for Hgb A1c recheck

## 2020-06-04 NOTE — Progress Notes (Signed)
Internal Medicine Clinic Attending  Case discussed with Dr. Lanierat the time of the visit.  We reviewed the resident's history and exam and pertinent patient test results.  I agree with the assessment, diagnosis, and plan of care documented in the resident's note.   

## 2022-11-02 ENCOUNTER — Encounter: Payer: Self-pay | Admitting: Student

## 2023-08-03 ENCOUNTER — Emergency Department (HOSPITAL_COMMUNITY)
Admission: EM | Admit: 2023-08-03 | Discharge: 2023-08-04 | Disposition: A | Discharge status: Home / Self Care | Payer: Self-pay | Attending: Emergency Medicine | Admitting: Emergency Medicine

## 2023-08-03 ENCOUNTER — Encounter (HOSPITAL_COMMUNITY): Payer: Self-pay | Admitting: *Deleted

## 2023-08-03 ENCOUNTER — Emergency Department (HOSPITAL_COMMUNITY): Payer: Self-pay

## 2023-08-03 ENCOUNTER — Other Ambulatory Visit: Payer: Self-pay

## 2023-08-03 DIAGNOSIS — D72829 Elevated white blood cell count, unspecified: Secondary | ICD-10-CM | POA: Insufficient documentation

## 2023-08-03 DIAGNOSIS — L03116 Cellulitis of left lower limb: Secondary | ICD-10-CM | POA: Insufficient documentation

## 2023-08-03 DIAGNOSIS — M79605 Pain in left leg: Secondary | ICD-10-CM

## 2023-08-03 LAB — BASIC METABOLIC PANEL
Anion gap: 6 (ref 5–15)
BUN: 21 mg/dL — ABNORMAL HIGH (ref 6–20)
CO2: 27 mmol/L (ref 22–32)
Calcium: 8.6 mg/dL — ABNORMAL LOW (ref 8.9–10.3)
Chloride: 101 mmol/L (ref 98–111)
Creatinine, Ser: 0.95 mg/dL (ref 0.61–1.24)
GFR, Estimated: >60 mL/min (ref >60)
Glucose, Bld: 216 mg/dL — ABNORMAL HIGH (ref 70–99)
Potassium: 5 mmol/L (ref 3.5–5.1)
Sodium: 134 mmol/L — ABNORMAL LOW (ref 135–145)

## 2023-08-03 LAB — CBC WITH DIFFERENTIAL/PLATELET
Abs Immature Granulocytes: 0.13 10*3/uL — ABNORMAL HIGH (ref 0.00–0.07)
Basophils Absolute: 0 10*3/uL (ref 0.0–0.1)
Basophils Relative: 0 %
Eosinophils Absolute: 0.1 10*3/uL (ref 0.0–0.5)
Eosinophils Relative: 1 %
HCT: 33.4 % — ABNORMAL LOW (ref 39.0–52.0)
Hemoglobin: 11.3 g/dL — ABNORMAL LOW (ref 13.0–17.0)
Immature Granulocytes: 1 %
Lymphocytes Relative: 14 %
Lymphs Abs: 1.5 10*3/uL (ref 0.7–4.0)
MCH: 30.3 pg (ref 26.0–34.0)
MCHC: 33.8 g/dL (ref 30.0–36.0)
MCV: 89.5 fL (ref 80.0–100.0)
Monocytes Absolute: 1.1 10*3/uL — ABNORMAL HIGH (ref 0.1–1.0)
Monocytes Relative: 9 %
Neutro Abs: 8.4 10*3/uL — ABNORMAL HIGH (ref 1.7–7.7)
Neutrophils Relative %: 75 %
Platelets: 436 10*3/uL — ABNORMAL HIGH (ref 150–400)
RBC: 3.73 MIL/uL — ABNORMAL LOW (ref 4.22–5.81)
RDW: 12.4 % (ref 11.5–15.5)
WBC: 11.2 10*3/uL — ABNORMAL HIGH (ref 4.0–10.5)
nRBC: 0 % (ref 0.0–0.2)

## 2023-08-03 MED ORDER — POVIDONE-IODINE 10 % EX SOLN
CUTANEOUS | Status: DC | PRN
  Filled 2023-08-03: qty 14.8

## 2023-08-03 MED ORDER — LIDOCAINE HCL (PF) 2 % IJ SOLN
10.0000 mL | Freq: Once | INTRAMUSCULAR | Status: AC
  Administered 2023-08-03: 10 mL

## 2023-08-03 NOTE — ED Provider Notes (Incomplete)
[place] EMERGENCY DEPARTMENT AT [facility] Provider Note   CSN: [id number] Arrival date & time: 08/03/23  1832     History {Add pertinent medical, surgical, social history, OB history to HPI:1} Chief Complaint  Patient presents with  . Knee Pain    Vincent Cochran is a 37 y.o. male.  The history is provided by the patient and a relative (daughter at bedside).  Knee Pain Location:  Knee Time since incident:  1 week Injury: no   Knee location:  L knee Pain details:    Quality:  Aching   Radiates to:  Does not radiate   Severity:  Mild   Onset quality:  Gradual   Duration:  1 week   Timing:  Constant   Progression:  Unchanged Chronicity:  New Foreign body present:  No foreign bodies Prior injury to area:  No Relieved by:  Nothing Worsened by:  Bearing weight Ineffective treatments: placed on ampicillin and diclofenac. Associated symptoms: swelling   Associated symptoms: no fever    Patient was visiting family in [place] last week.  He describes kneeling on a cement floor while he wash his grandmothers feet, stating he was on his left knee for about 10 minutes, shortly after standing he developed swelling in the knee which has persisted.  He was seen by an MD in [place] who put him on ampicillin and diclofenac has now been on this medication for 6 to 7 days with no improvement.  He has mild pain, mostly describes pressure with swelling which extends down into his foot, worsened by long periods of standing, improved when he elevates his leg overnight.  He denies fevers, nausea or vomiting.  He can flex and extend the knee without significant discomfort, ranging the knee is limited by swelling however.    Home Medications Prior to Admission medications   Medication Sig Start Date End Date Taking? Authorizing Provider  canagliflozin (INVOKANA) 100 MG TABS tablet Take 1 tablet (100 mg total) by mouth daily before breakfast. 06/01/20   Jenell Milliner, MD  clotrimazole  (LOTRIMIN) 1 % cream Apply 1 application topically 2 (two) times daily. 06/01/20   Jenell Milliner, MD  fluticasone [facility]) 50 MCG/ACT nasal spray Place 1 spray into both nostrils daily. 06/01/20   Jenell Milliner, MD  ibuprofen (ADVIL,MOTRIN) 800 MG tablet Take 1 tablet (800 mg total) by mouth every 8 (eight) hours as needed. Take with food 06/28/18   Triplett, Tammy, PA-C  SitaGLIPtin-MetFORMIN HCl [phone] MG TB24 Take 1 tablet by mouth daily. 06/01/20   Jenell Milliner, MD      Allergies    Patient has no known allergies.    Review of Systems   Review of Systems  Constitutional:  Negative for chills and fever.  Respiratory: Negative.    Cardiovascular: Negative.   Musculoskeletal:  Positive for arthralgias and joint swelling. Negative for myalgias.  Skin:  Positive for color change.  Neurological:  Negative for weakness and numbness.  All other systems reviewed and are negative.   Physical Exam Updated Vital Signs BP (!) 182/90 (BP Location: Right Arm)   Pulse 79   Temp 99.4 F (37.4 C) (Oral)   Resp 16   Ht 5\' 6"  (1.676 m)   Wt 77.1 kg   SpO2 98%   BMI 27.44 kg/m  Physical Exam Constitutional:      Appearance: He is well-developed.  HENT:     Head: Atraumatic.  Cardiovascular:     Comments: Pulses equal bilaterally  Musculoskeletal:        General: Swelling and tenderness present.     Cervical back: Normal range of motion.     Left knee: Swelling and erythema present. Decreased range of motion.     Comments: Soft nonpitting edema from the left knee to the patient's dorsal foot.  There is increased warmth and erythema noted to the dorsal and medial knee.  He is relatively nontender to palpation of the knee and can flex and extend to about 30 degrees, denies pain with this maneuver.  No red streaking.  Skin:    General: Skin is warm and dry.  Neurological:     Mental Status: He is alert.     Sensory: No sensory deficit.     Motor: No weakness.     ED Results / Procedures  / Treatments   Labs (all labs ordered are listed, but only abnormal results are displayed) Labs Reviewed  BODY FLUID CULTURE W GRAM STAIN  CBC WITH DIFFERENTIAL/PLATELET  BASIC METABOLIC PANEL  SYNOVIAL CELL COUNT + DIFF, W/ CRYSTALS    EKG None  Radiology DG Knee Complete 4 Views Left  Result Date: 08/03/2023 CLINICAL DATA:  pain/ swelling EXAM: LEFT KNEE - COMPLETE 4+ VIEW COMPARISON:  None Available. FINDINGS: No evidence of fracture or dislocation. Trace joint effusion. No evidence of arthropathy or other focal bone abnormality. Soft tissues are unremarkable. IMPRESSION: Negative. Electronically Signed   By: Tish Frederickson M.D.   On: 08/03/2023 19:28    Procedures Procedures  {Document cardiac monitor, telemetry assessment procedure when appropriate:1}  Medications Ordered in ED Medications  povidone-iodine (BETADINE) 10 % external solution (has no administration in time range)  lidocaine HCl (PF) (XYLOCAINE) 2 % injection 10 mL (has no administration in time range)    ED Course/ Medical Decision Making/ A&P   {   Click here for ABCD2, HEART and other calculatorsREFRESH Note before signing :1}                              Medical Decision Making Amount and/or Complexity of Data Reviewed Labs: ordered. Radiology: ordered.  Risk OTC drugs. Prescription drug management.     {Document critical care time when appropriate:1} {Document review of labs and clinical decision tools ie heart score, Chads2Vasc2 etc:1}  {Document your independent review of radiology images, and any outside records:1} {Document your discussion with family members, caretakers, and with consultants:1} {Document social determinants of health affecting pt's care:1} {Document your decision making why or why not admission, treatments were needed:1} Final Clinical Impression(s) / ED Diagnoses Final diagnoses:  None    Rx / DC Orders ED Discharge Orders     None

## 2023-08-03 NOTE — ED Triage Notes (Signed)
Pt with left knee pain and swelling for a week. Pt went to [place] and seen MD there, started on Fortical and ampicilin .  Pt stated that he was on his knees and not long after swelling started.  Denies fever, feels warm to touch and swelling noted in triage.

## 2023-08-03 NOTE — ED Provider Notes (Signed)
[place] EMERGENCY DEPARTMENT AT [facility] Provider Note   CSN: [id number] Arrival date & time: 08/03/23  1832     History  Chief Complaint  Patient presents with   Knee Pain    Vincent Cochran is a 37 y.o. male.  The history is provided by the patient and a relative (daughter at bedside).  Knee Pain Location:  Knee Time since incident:  1 week Injury: no   Knee location:  L knee Pain details:    Quality:  Aching   Radiates to:  Does not radiate   Severity:  Mild   Onset quality:  Gradual   Duration:  1 week   Timing:  Constant   Progression:  Unchanged Chronicity:  New Foreign body present:  No foreign bodies Prior injury to area:  No Relieved by:  Nothing Worsened by:  Bearing weight Ineffective treatments: placed on ampicillin and diclofenac. Associated symptoms: swelling   Associated symptoms: no fever    Patient was visiting family in [place] last week.  He describes kneeling on a cement floor while he wash his grandmothers feet, stating he was on his left knee for about 10 minutes, shortly after standing he developed swelling in the knee which has persisted.  He was seen by an MD in [place] who put him on ampicillin and diclofenac has now been on this medication for 6 to 7 days with no improvement.  He has mild pain, mostly describes pressure with swelling which extends down into his foot, worsened by long periods of standing, improved when he elevates his leg overnight.  He denies fevers, nausea or vomiting.  He can flex and extend the knee without significant discomfort, ranging the knee is limited by swelling however.    Home Medications Prior to Admission medications   Medication Sig Start Date End Date Taking? Authorizing Provider  clindamycin (CLEOCIN) 150 MG capsule Take 3 capsules (450 mg total) by mouth 3 (three) times daily for 7 days. 08/04/23 08/11/23 Yes Rahmon Heigl, Raynelle Fanning, PA-C  canagliflozin [facility]) 100 MG TABS tablet Take 1 tablet (100 mg  total) by mouth daily before breakfast. 06/01/20   Jenell Milliner, MD  clotrimazole (LOTRIMIN) 1 % cream Apply 1 application topically 2 (two) times daily. 06/01/20   Jenell Milliner, MD  fluticasone [facility]) 50 MCG/ACT nasal spray Place 1 spray into both nostrils daily. 06/01/20   Jenell Milliner, MD  ibuprofen (ADVIL,MOTRIN) 800 MG tablet Take 1 tablet (800 mg total) by mouth every 8 (eight) hours as needed. Take with food 06/28/18   Triplett, Tammy, PA-C  SitaGLIPtin-MetFORMIN HCl [phone] MG TB24 Take 1 tablet by mouth daily. 06/01/20   Jenell Milliner, MD      Allergies    Patient has no known allergies.    Review of Systems   Review of Systems  Constitutional:  Negative for chills and fever.  Respiratory: Negative.    Cardiovascular: Negative.   Musculoskeletal:  Positive for arthralgias and joint swelling. Negative for myalgias.  Skin:  Positive for color change.  Neurological:  Negative for weakness and numbness.  All other systems reviewed and are negative.   Physical Exam Updated Vital Signs BP (!) 178/88 (BP Location: Right Arm)   Pulse 84   Temp 99.3 F (37.4 C) (Oral)   Resp 16   Ht 5\' 6"  (1.676 m)   Wt 77.1 kg   SpO2 98%   BMI 27.44 kg/m  Physical Exam Constitutional:      Appearance: He is well-developed.  HENT:     Head: Atraumatic.  Cardiovascular:     Comments: Pulses equal bilaterally Musculoskeletal:        General: Swelling and tenderness present.     Cervical back: Normal range of motion.     Left knee: Swelling and erythema present. Decreased range of motion.     Comments: Soft nonpitting edema from the left knee to the patient's dorsal foot.  There is increased warmth and erythema noted to the dorsal and medial knee.  He is relatively nontender to palpation of the knee and can flex and extend to about 30 degrees, denies pain with this maneuver.  No red streaking.  Skin:    General: Skin is warm and dry.  Neurological:     Mental Status: He is alert.      Sensory: No sensory deficit.     Motor: No weakness.     ED Results / Procedures / Treatments   Labs (all labs ordered are listed, but only abnormal results are displayed) Labs Reviewed  CBC WITH DIFFERENTIAL/PLATELET - Abnormal; Notable for the following components:      Result Value   WBC 11.2 (*)    RBC 3.73 (*)    Hemoglobin 11.3 (*)    HCT 33.4 (*)    Platelets 436 (*)    Neutro Abs 8.4 (*)    Monocytes Absolute 1.1 (*)    Abs Immature Granulocytes 0.13 (*)    All other components within normal limits  BASIC METABOLIC PANEL - Abnormal; Notable for the following components:   Sodium 134 (*)    Glucose, Bld 216 (*)    BUN 21 (*)    Calcium 8.6 (*)    All other components within normal limits    EKG None  Radiology DG Knee Complete 4 Views Left  Result Date: 08/03/2023 CLINICAL DATA:  pain/ swelling EXAM: LEFT KNEE - COMPLETE 4+ VIEW COMPARISON:  None Available. FINDINGS: No evidence of fracture or dislocation. Trace joint effusion. No evidence of arthropathy or other focal bone abnormality. Soft tissues are unremarkable. IMPRESSION: Negative. Electronically Signed   By: Tish Frederickson M.D.   On: 08/03/2023 19:28    Procedures .Joint Aspiration/Arthrocentesis  Date/Time: 08/04/2023 12:41 AM  Performed by: Burgess Amor, PA-C Authorized by: Burgess Amor, PA-C   Consent:    Consent obtained:  Verbal   Consent given by:  Patient   Risks, benefits, and alternatives were discussed: yes     Risks discussed:  Infection and bleeding   Alternatives discussed:  No treatment Universal protocol:    Procedure explained and questions answered to patient or proxy's satisfaction: yes     Imaging studies available: yes     Immediately prior to procedure, a time out was called: yes     Patient identity confirmed:  Verbally with patient Location:    Location:  Knee   Knee:  L knee Anesthesia:    Anesthesia method:  Local infiltration   Local anesthetic:  Lidocaine 2% w/o  epi Procedure details:    Preparation: Patient was prepped and draped in usual sterile fashion     Needle gauge:  18 G   Ultrasound guidance: no     Approach:  Superior   Aspirate amount:  0   Steroid injected: no     Specimen collected: no   Post-procedure details:    Dressing:  Adhesive bandage   Procedure completion:  Tolerated well, no immediate complications       Medications Ordered  in ED Medications  povidone-iodine (BETADINE) 10 % external solution (has no administration in time range)  apixaban (ELIQUIS) tablet 10 mg (has no administration in time range)  lidocaine HCl (PF) (XYLOCAINE) 2 % injection 10 mL (10 mLs Other Given 08/03/23 2350)  clindamycin (CLEOCIN) capsule 300 mg (300 mg Oral Given 08/04/23 0032)    ED Course/ Medical Decision Making/ A&P                                 Medical Decision Making Patient with complaint of left knee with mild erythema along with radiation of edema to dorsal foot.  On day 6 of ampicillin without any improvement for presumed skin infection.  He recently traveled to [place], describing number two 2-hour flights, no history of DVT but exam is concerning about this possibility.  He also has some increased warmth at the knee, mild erythema medially and dorsal patellar region.  He can flex the knee without too much discomfort, making diagnosis of septic joint less likely but still possible.  He is a diabetic, compliant with his metformin, denies fevers.  Plain film imaging revealing minimal effusion.  Given exam I did attempt to collect knee joint aspiration without success.  The left lower extremity ultrasound has been ordered, patient is aware that he will need to call in the morning for an appointment time to come back for this test tomorrow.  He is given a dose of Eliquis here, he was also switched from ampicillin to clindamycin, first dose given here.  He understands and agrees with this plan.  Amount and/or Complexity of Data  Reviewed Labs: ordered.    Details: Be met significant for glucose of 216, his CBC is positive for mild leukocytosis at 11.2. Radiology: ordered.    Details: Left knee film reviewed and agree with interpretation, minimal effusion appreciated.  Risk OTC drugs. Prescription drug management.           Final Clinical Impression(s) / ED Diagnoses Final diagnoses:  Pain and swelling of left lower extremity  Cellulitis of left lower extremity    Rx / DC Orders ED Discharge Orders          Ordered    clindamycin (CLEOCIN) 150 MG capsule  3 times daily        08/04/23 0031    US Venous Img Lower Unilateral Left        08/04/23 0031              Burgess Amor, PA-C 08/04/23 0042    Mesner, Barbara Cower, MD 08/04/23 6195

## 2023-08-03 NOTE — ED Provider Notes (Signed)
I provided a substantive portion of the care of this patient.  I personally made/approved the management plan for this patient and take responsibility for the patient management.   37 year old male comes in with persistent left medial knee pain for the last week.  Was in [place] and it started after doing something on the floor.  Went to her doctor to started on anti-inflammatories and antibiotics.  Has not seem to get a better presents here for further evaluation.  He flew down to [place] and back.  The main pain and swelling is on the medial side of his left knee where there is some erythema however is whole left lower extremity from the mid thigh down appears to be slightly edematous.  Has good pulses and good neurologic function.  Oral temperature 99.9 on arrival but has good range of motion knee and no other recent illnesses.  Possibly septic arthritis but seems very unlikely at this time unless it is partially treated of course.  Also consider possible DVT.  We do not have the ability ultrasound tonight so would suggest doing an arthrocentesis with follow up US in AM. I'd start a different antibiotic along with a prescription for anticoagulation and pain meds to start after ultrasound tomorrow morning.  {Remember to document shared critical care using "edcritical" dot phrase:1}

## 2023-08-04 ENCOUNTER — Ambulatory Visit (HOSPITAL_COMMUNITY)
Admission: RE | Admit: 2023-08-04 | Discharge: 2023-08-04 | Disposition: A | Discharge status: Home / Self Care | Payer: Self-pay | Source: Ambulatory Visit | Attending: Emergency Medicine | Admitting: Emergency Medicine

## 2023-08-04 DIAGNOSIS — R600 Localized edema: Secondary | ICD-10-CM | POA: Insufficient documentation

## 2023-08-04 MED ORDER — CLINDAMYCIN HCL 150 MG PO CAPS
300.0000 mg | ORAL_CAPSULE | Freq: Once | ORAL | Status: AC
  Administered 2023-08-04: 300 mg via ORAL
  Filled 2023-08-04: qty 2

## 2023-08-04 MED ORDER — CLINDAMYCIN HCL 150 MG PO CAPS: 450.0000 mg | ORAL_CAPSULE | Freq: Three times a day (TID) | ORAL | 0 refills | Status: AC

## 2023-08-04 MED ORDER — APIXABAN 5 MG PO TABS
10.0000 mg | ORAL_TABLET | Freq: Once | ORAL | Status: AC
  Administered 2023-08-04: 10 mg via ORAL

## 2023-08-04 NOTE — ED Provider Notes (Signed)
Patient came in for ultrasound that was ordered last night.  He has been having left leg swelling specially with some knee pain.  His whole leg is swollen.  He had a flight to [place] history of factor for DVT can was given a dose of Eliquis last night.  Also changed to clindamycin last night for presumed cellulitis.  He was given dose last night going to pick up the prescription this morning after he leaves.  Reviewed the ultrasound report from radiology there is no evidence of DVT.  He is able to ambulate and has good range of motion of the joint, has not been complaining of fevers.  States that symptoms are about the same, only new complaint is pain at the site of the attempted arthrocentesis.   I discussed with patient since he is having these continued symptoms needs to follow-up with PCP and/or orthopedics.  I reprinted his paperwork to include orthopedic contact information and PCP information.  He is advised on strict return precautions.   Josem Kaufmann 08/04/23 1006    Terrilee Files, MD 08/04/23 [phone]

## 2023-08-04 NOTE — Discharge Instructions (Addendum)
You are being switched to a stronger antibiotic for your suspected skin infection.  Stop taking your ampicillin and start the new antibiotic tomorrow.  An ultrasound has been ordered for your leg to rule out a blood clot as the reason for the swelling in your leg.  Call the phone number circled in the morning to schedule your appointment time to come back and have this test done in the morning.  If it is positive for a blood clot you will receive additional instructions tomorrow.    This Is a repeat of your paperwork from  last night but I have added orthopedic follow-up.  I am also adding primary care doctors.  Come back for new or worsening symptoms  [facility] Doctor List    Syliva Overman, MD. Specialty: [facility] Medicine Contact information: [address]  [place] [address]  [phone]   Lilyan Punt, MD. Specialty: [facility] Medicine Contact information: [address]  [place] [address]  [phone]   Avon Gully, MD Specialty: Internal Medicine Contact information: [address] [place] [address]  [phone]   Catalina Pizza, MD. Specialty: Internal Medicine Contact information: [address]  [place] [address]  [phone]    [facility] (Dr. Selena Batten) Specialty: Family Medicine Contact information: [address]  [place] [address]  [phone]   John Giovanni, MD. Specialty: [facility] Contact information: [address]  [address]  [place] [address]  [phone]   Carylon Perches, MD. Specialty: Internal Medicine Contact information: [address]  [address]  [place] [address]  [phone]    [facility]  [address] [place], [place] [address] [phone]  Services [facility] offers a variety of basic health services.  Services include but are not limited to: Blood pressure checks  Heart  rate checks  Blood sugar checks  Urine analysis  Rapid strep tests  Pregnancy tests.  Health education and referrals  People needing more complex services will be directed to a physician online. Using these virtual visits, doctors can evaluate and prescribe medicine and treatments. There will be no medication on-site, though [facility] will help patients fill their prescriptions at little to no cost.   For More information please go to: [email or web address]

## 2024-04-29 ENCOUNTER — Emergency Department (HOSPITAL_COMMUNITY)
Admission: EM | Admit: 2024-04-29 | Discharge: 2024-04-29 | Disposition: A | Discharge status: Home / Self Care | Payer: Self-pay | Attending: Emergency Medicine | Admitting: Emergency Medicine

## 2024-04-29 ENCOUNTER — Emergency Department (HOSPITAL_COMMUNITY): Payer: Self-pay

## 2024-04-29 ENCOUNTER — Encounter (HOSPITAL_COMMUNITY): Payer: Self-pay | Admitting: Emergency Medicine

## 2024-04-29 ENCOUNTER — Other Ambulatory Visit: Payer: Self-pay

## 2024-04-29 DIAGNOSIS — R112 Nausea with vomiting, unspecified: Secondary | ICD-10-CM | POA: Insufficient documentation

## 2024-04-29 DIAGNOSIS — R1084 Generalized abdominal pain: Secondary | ICD-10-CM | POA: Insufficient documentation

## 2024-04-29 LAB — COMPREHENSIVE METABOLIC PANEL WITH GFR
ALT: 29 U/L (ref 0–44)
AST: 31 U/L (ref 15–41)
Albumin: 2.2 g/dL — ABNORMAL LOW (ref 3.5–5.0)
Alkaline Phosphatase: 61 U/L (ref 38–126)
Anion gap: 5 (ref 5–15)
BUN: 18 mg/dL (ref 6–20)
CO2: 26 mmol/L (ref 22–32)
Calcium: 8.2 mg/dL — ABNORMAL LOW (ref 8.9–10.3)
Chloride: 106 mmol/L (ref 98–111)
Creatinine, Ser: 1.15 mg/dL (ref 0.61–1.24)
GFR, Estimated: >60 mL/min (ref >60)
Glucose, Bld: 172 mg/dL — ABNORMAL HIGH (ref 70–99)
Potassium: 4.3 mmol/L (ref 3.5–5.1)
Sodium: 137 mmol/L (ref 135–145)
Total Bilirubin: 0.4 mg/dL (ref 0.0–1.2)
Total Protein: 4.9 g/dL — ABNORMAL LOW (ref 6.5–8.1)

## 2024-04-29 LAB — CBC
HCT: 31.5 % — ABNORMAL LOW (ref 39.0–52.0)
Hemoglobin: 10.7 g/dL — ABNORMAL LOW (ref 13.0–17.0)
MCH: 30.4 pg (ref 26.0–34.0)
MCHC: 34 g/dL (ref 30.0–36.0)
MCV: 89.5 fL (ref 80.0–100.0)
Platelets: 299 10*3/uL (ref 150–400)
RBC: 3.52 MIL/uL — ABNORMAL LOW (ref 4.22–5.81)
RDW: 12.6 % (ref 11.5–15.5)
WBC: 8.1 10*3/uL (ref 4.0–10.5)
nRBC: 0 % (ref 0.0–0.2)

## 2024-04-29 LAB — URINALYSIS, ROUTINE W REFLEX MICROSCOPIC
Bilirubin Urine: NEGATIVE
Glucose, UA: 150 mg/dL — AB
Ketones, ur: NEGATIVE mg/dL
Leukocytes,Ua: NEGATIVE
Nitrite: NEGATIVE
Protein, ur: >=300 mg/dL — AB
Specific Gravity, Urine: 1.016 (ref 1.005–1.030)
pH: 6 (ref 5.0–8.0)

## 2024-04-29 LAB — CBG MONITORING, ED: Glucose-Capillary: 158 mg/dL — ABNORMAL HIGH (ref 70–99)

## 2024-04-29 LAB — LIPASE, BLOOD: Lipase: 25 U/L (ref 11–51)

## 2024-04-29 MED ORDER — IOHEXOL 350 MG/ML SOLN
75.0000 mL | Freq: Once | INTRAVENOUS | Status: AC | PRN
  Administered 2024-04-29: 75 mL via INTRAVENOUS

## 2024-04-29 MED ORDER — OMEPRAZOLE MAGNESIUM 20 MG PO TBEC: 20.0000 mg | DELAYED_RELEASE_TABLET | Freq: Every day | ORAL | 0 refills | Status: AC

## 2024-04-29 MED ORDER — POLYETHYLENE GLYCOL 3350 17 GM/SCOOP PO POWD: ORAL | 0 refills | Status: AC

## 2024-04-29 NOTE — ED Triage Notes (Signed)
 Pt reports abd pain and vomiting for a few months. Says he saw his doctor awhile back but it has continued. LUQ. Swelling noticed to ankles.

## 2024-04-29 NOTE — ED Provider Notes (Signed)
 [place] EMERGENCY DEPARTMENT AT [facility] Provider Note   CSN: [id number] Arrival date & time: 04/29/24  4098     History  Chief Complaint  Patient presents with   Abdominal Pain   Emesis    Vincent Cochran is a 38 y.o. male.  Pt complains of abdominal pain and nausea.  Pt reports intermittent vomiting   The history is provided by the patient. No language interpreter was used.  Abdominal Pain Pain location:  Generalized Pain quality: aching   Pain radiates to:  Does not radiate Pain severity:  Moderate Onset quality:  Gradual Timing:  Constant Progression:  Worsening Chronicity:  New Relieved by:  Nothing Worsened by:  Nothing Ineffective treatments:  None tried Associated symptoms: vomiting   Emesis Associated symptoms: abdominal pain        Home Medications Prior to Admission medications   Medication Sig Start Date End Date Taking? Authorizing Provider  acarbose (PRECOSE) 100 MG tablet Take 100 mg by mouth 3 (three) times daily. 02/05/24  Yes [provider]  canagliflozin  (INVOKANA ) 100 MG TABS tablet Take 1 tablet (100 mg total) by mouth daily before breakfast. 06/01/20  Yes Angelica Kemp, MD  glimepiride (AMARYL) 4 MG tablet Take 4 mg by mouth daily. 02/05/24  Yes [provider]  lisinopril-hydrochlorothiazide (ZESTORETIC) 20-12.5 MG tablet Take 1 tablet by mouth daily. 02/05/24  Yes [provider]  metFORMIN  (GLUCOPHAGE ) 1000 MG tablet Take 1,000 mg by mouth 2 (two) times daily. 02/05/24  Yes [provider]  omeprazole (PRILOSEC OTC) 20 MG tablet Take 1 tablet (20 mg total) by mouth daily. 04/29/24 04/29/25 Yes Relena Ivancic K, PA-C  polyethylene glycol powder (MIRALAX) 17 GM/SCOOP powder One dose twice a day for 3 days then one a day as needed. 04/29/24  Yes Neiko Trivedi K, PA-C  clotrimazole  (LOTRIMIN ) 1 % cream Apply 1 application topically 2 (two) times daily. Patient not taking: Reported on 04/29/2024 06/01/20    Angelica Kemp, MD  fluticasone  (FLONASE ) 50 MCG/ACT nasal spray Place 1 spray into both nostrils daily. Patient not taking: Reported on 04/29/2024 06/01/20   Angelica Kemp, MD  ibuprofen  (ADVIL ,MOTRIN ) 800 MG tablet Take 1 tablet (800 mg total) by mouth every 8 (eight) hours as needed. Take with food Patient not taking: Reported on 04/29/2024 06/28/18   Catherne Clubs, PA-C  omeprazole (PRILOSEC) 40 MG capsule Take 40 mg by mouth daily. Patient not taking: Reported on 04/29/2024 02/05/24   [provider]  sildenafil (VIAGRA) 100 MG tablet Take 100 mg by mouth as directed. Patient not taking: Reported on 04/29/2024 02/05/24   [provider]  SitaGLIPtin -MetFORMIN  HCl [phone] MG TB24 Take 1 tablet by mouth daily. Patient not taking: Reported on 04/29/2024 06/01/20   Angelica Kemp, MD      Allergies    Patient has no known allergies.    Review of Systems   Review of Systems  Gastrointestinal:  Positive for abdominal pain and vomiting.  All other systems reviewed and are negative.   Physical Exam Updated Vital Signs BP (!) 166/96 (BP Location: Right Arm)   Pulse 62   Temp 98.1 F (36.7 C) (Oral)   Resp 18   Ht 5\' 6"  (1.676 m)   Wt 77 kg   SpO2 100%   BMI 27.40 kg/m  Physical Exam Vitals and nursing note reviewed.  Constitutional:      Appearance: He is well-developed.  HENT:     Head: Normocephalic.  Cardiovascular:  Rate and Rhythm: Normal rate and regular rhythm.  Pulmonary:     Effort: Pulmonary effort is normal.  Abdominal:     General: Abdomen is flat. There is no distension.     Palpations: Abdomen is soft.     Tenderness: There is generalized abdominal tenderness.  Musculoskeletal:        General: Normal range of motion.     Cervical back: Normal range of motion.  Skin:    General: Skin is warm.  Neurological:     General: No focal deficit present.     Mental Status: He is alert and oriented to person, place, and time.     ED Results /  Procedures / Treatments   Labs (all labs ordered are listed, but only abnormal results are displayed) Labs Reviewed  COMPREHENSIVE METABOLIC PANEL WITH GFR - Abnormal; Notable for the following components:      Result Value   Glucose, Bld 172 (*)    Calcium 8.2 (*)    Total Protein 4.9 (*)    Albumin 2.2 (*)    All other components within normal limits  CBC - Abnormal; Notable for the following components:   RBC 3.52 (*)    Hemoglobin 10.7 (*)    HCT 31.5 (*)    All other components within normal limits  URINALYSIS, ROUTINE W REFLEX MICROSCOPIC - Abnormal; Notable for the following components:   APPearance HAZY (*)    Glucose, UA 150 (*)    Hgb urine dipstick SMALL (*)    Protein, ur >=300 (*)    Bacteria, UA RARE (*)    All other components within normal limits  CBG MONITORING, ED - Abnormal; Notable for the following components:   Glucose-Capillary 158 (*)    All other components within normal limits  LIPASE, BLOOD    EKG None  Radiology CT ABDOMEN PELVIS W CONTRAST Result Date: 04/29/2024 CLINICAL DATA:  Left upper quadrant abdominal pain EXAM: CT ABDOMEN AND PELVIS WITH CONTRAST TECHNIQUE: Multidetector CT imaging of the abdomen and pelvis was performed using the standard protocol following bolus administration of intravenous contrast. RADIATION DOSE REDUCTION: This exam was performed according to the departmental dose-optimization program which includes automated exposure control, adjustment of the mA and/or kV according to patient size and/or use of iterative reconstruction technique. CONTRAST:  75mL OMNIPAQUE IOHEXOL 350 MG/ML SOLN COMPARISON:  None Available. FINDINGS: Lower chest: No infiltrates or consolidations, no pleural effusions Hepatobiliary: Liver normal size no masses no biliary dilatation. Gallbladder unremarkable. No gallstones. Pancreas: Pancreas normal size. No masses calcifications or inflammatory changes. Spleen: Spleen normal size.  No masses.  Adrenals/Urinary Tract: Adrenal glands are normal size. Follow-up recommended. Kidneys are normal. No masses calcifications or hydronephrosis Stomach/Bowel: No small or large bowel obstruction or inflammatory changes. Moderate amount of residual fecal material throughout the colon without obstruction or constipation. Normal cecal appendix without evidence of appendicitis. No diverticulitis. No inflammatory changes of the abdomen or pelvis. Vascular/Lymphatic: No significant vascular findings are present. No enlarged abdominal or pelvic lymph nodes. Reproductive: .  No masses. Bladder unremarkable. Other: Anterior abdominal wall unremarkable without evidence of umbilical or inguinal hernias Musculoskeletal: Visualized portion of the thoracolumbar spine and pelvic structures grossly unremarkable without evidence of fracture bony abnormalities or soft tissue masses. IMPRESSION: *No acute findings in the abdomen or pelvis. *Moderate amount of residual fecal material throughout the colon without obstruction or constipation. Electronically Signed   By: Fredrich Jefferson M.D.   On: 04/29/2024 10:28  Procedures Procedures    Medications Ordered in ED Medications  iohexol (OMNIPAQUE) 350 MG/ML injection 75 mL (75 mLs Intravenous Contrast Given 04/29/24 1002)    ED Course/ Medical Decision Making/ A&P                                 Medical Decision Making Patient complains of a pain.  Patient reports he had discomfort on and off for a few months.  Patient reports he has been seen for the same in the past and treated.  Patient reports some vomiting over the weekend.  Patient is diabetic.  He reports he is taking his medicine as directed  Amount and/or Complexity of Data Reviewed Independent Historian: spouse Labs: ordered. Decision-making details documented in ED Course.    Details: Labs ordered reviewed and Radiology: ordered and independent interpretation performed. Decision-making details documented in  ED Course.    Details: CT scan shows no acute findings moderate amount of stool.  Risk OTC drugs. Prescription drug management. Risk Details: I advised patient to try taking MiraLAX.  He is given a prescription for Prilosec as well.  Patient is advised to follow-up with his primary care physician for recheck.  Patient advised to return to the emergency department if symptoms worsen or change.           Final Clinical Impression(s) / ED Diagnoses Final diagnoses:  Generalized abdominal pain    Rx / DC Orders ED Discharge Orders          Ordered    omeprazole (PRILOSEC OTC) 20 MG tablet  Daily        04/29/24 1122    polyethylene glycol powder (MIRALAX) 17 GM/SCOOP powder        04/29/24 1122          An After Visit Summary was printed and given to the patient.     Sandi Crosby, PA-C 04/29/24 1547    Trish Furl, MD 04/30/24 [phone]

## 2024-04-29 NOTE — ED Notes (Signed)
 IV placed for CT w/ Contrast order.

## 2024-04-29 NOTE — Discharge Instructions (Addendum)
 Take miralax. 2 doses a day for the next 3 days then once a day as needed.  Follow up with your Physicain for recheck

## 2024-04-29 NOTE — ED Notes (Signed)
 Pt transported to CT ?
